# Patient Record
Sex: Female | Born: 1995 | Race: White | Hispanic: No | Marital: Single | Smoking: Never smoker
Health system: Southern US, Community
[De-identification: ages and names within clinical notes are randomized; demographics above are authoritative.]

## PROBLEM LIST (undated history)

## (undated) DIAGNOSIS — J189 Pneumonia, unspecified organism: Secondary | ICD-10-CM

## (undated) DIAGNOSIS — Z8619 Personal history of other infectious and parasitic diseases: Secondary | ICD-10-CM

## (undated) HISTORY — DX: Personal history of other infectious and parasitic diseases: Z86.19

## (undated) HISTORY — DX: Pneumonia, unspecified organism: J18.9

## (undated) HISTORY — PX: TONSILLECTOMY: SUR1361

---
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
## 2013-03-02 IMAGING — CT CT ABD-PELV W/ CM
2 of 4 series · 16 of 46 positions shown, 18 images · IV contrast (APPLIED)
Comparison: None.

CLINICAL DATA: Abdomen pain, vomiting since [REDACTED]

CT ABDOMEN AND PELVIS WITH CONTRAST
TECHNIQUE: Multidetector CT imaging of the abdomen and pelvis was
performed following the standard protocol during bolus
administration of intravenous contrast.
Contrast: 100mL OMNIPAQUE IOHEXOL 300 MG/ML  SOLN

[Series 2: abd/pelvis 5.0 b31f · axial · 0.66mm/px · z∈[-656,-276]mm · 13 of 84 slices shown, 15 images]
[im 4/84  soft-tissue]
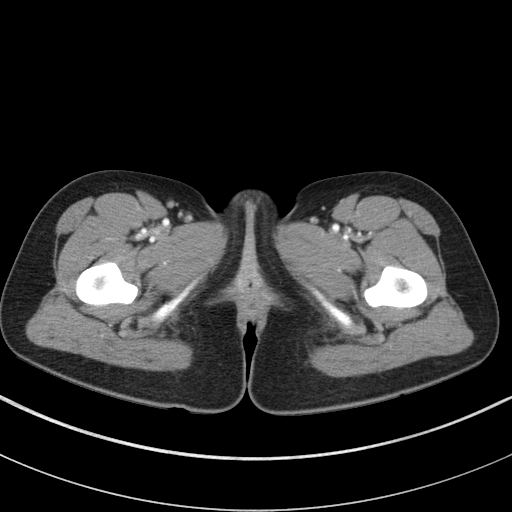
[im 4/84  bone]
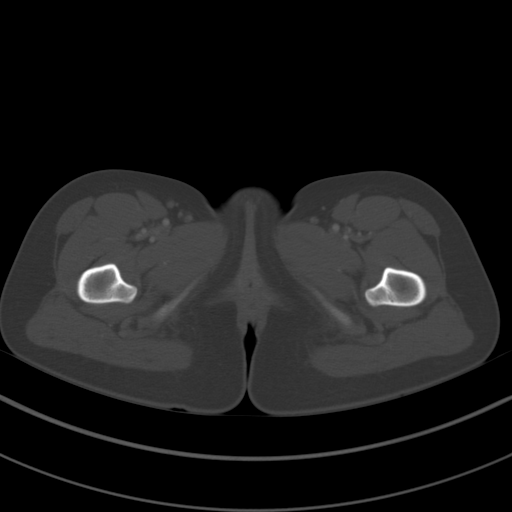
[im 10/84  soft-tissue]
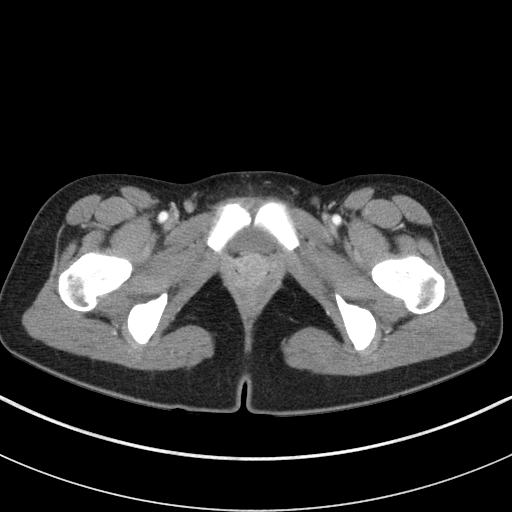
[im 17/84  soft-tissue]
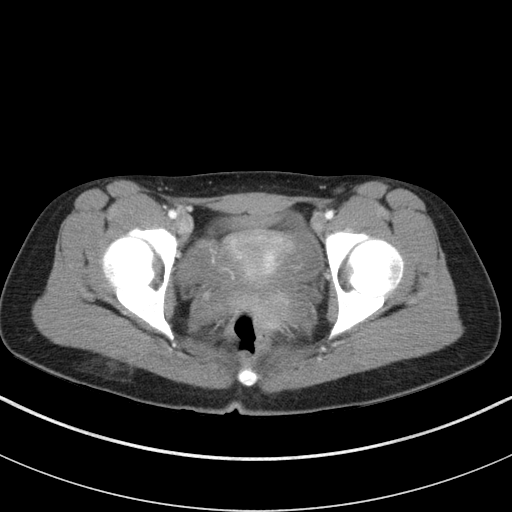
[im 24/84  soft-tissue]
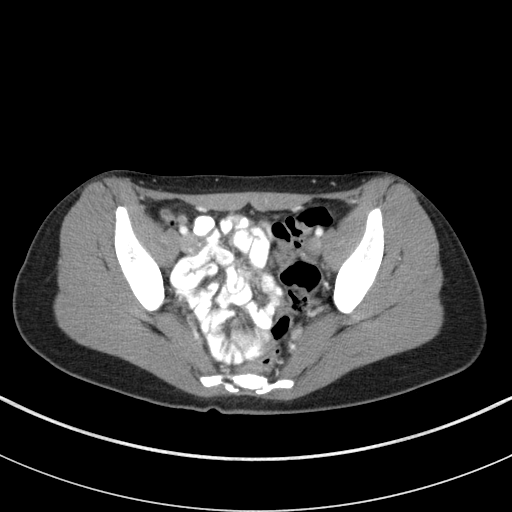
[im 30/84  soft-tissue]
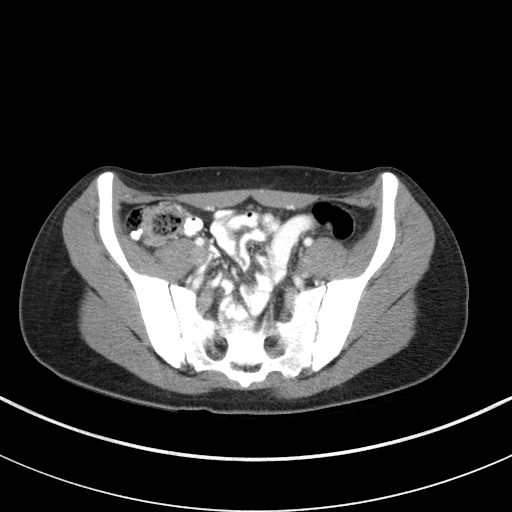
[im 37/84  soft-tissue]
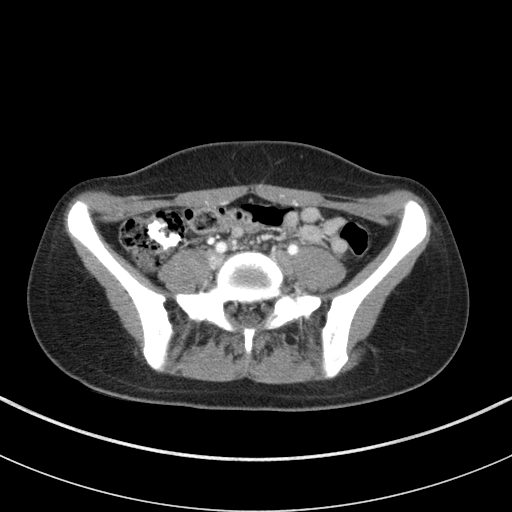
[im 44/84  soft-tissue]
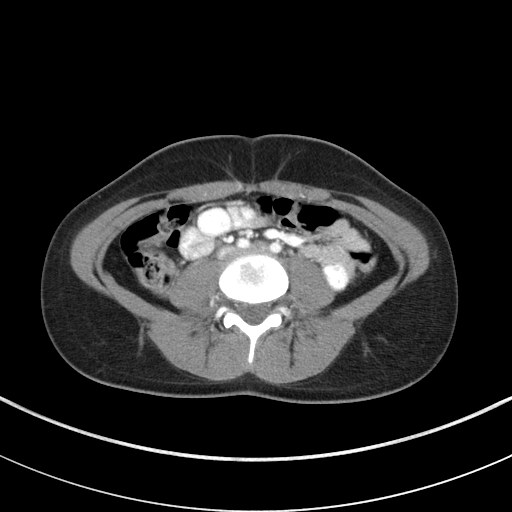
[im 47/84  soft-tissue]
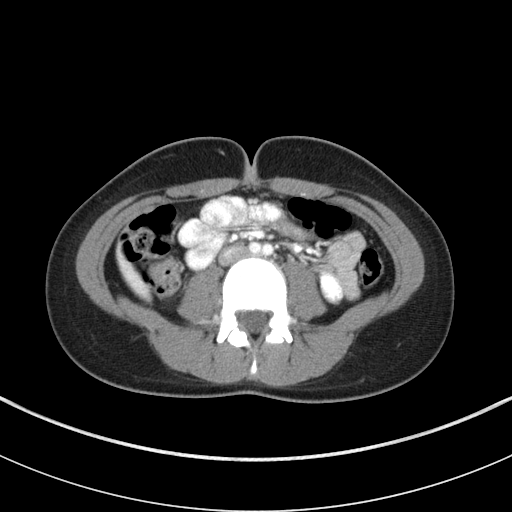
[im 54/84  soft-tissue]
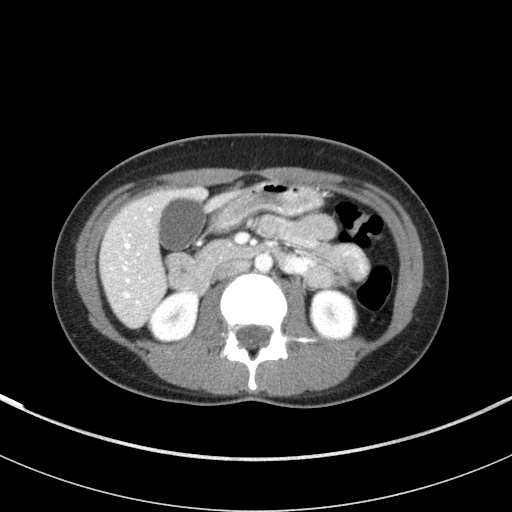
[im 54/84  bone]
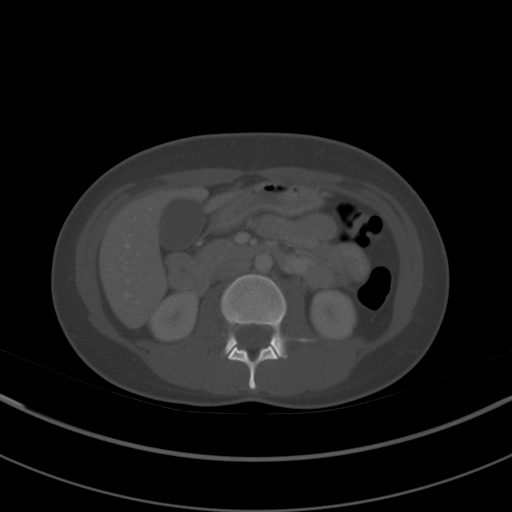
[im 60/84  soft-tissue]
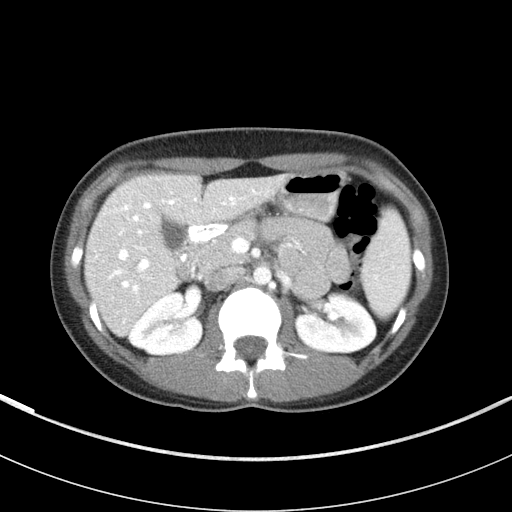
[im 67/84  soft-tissue]
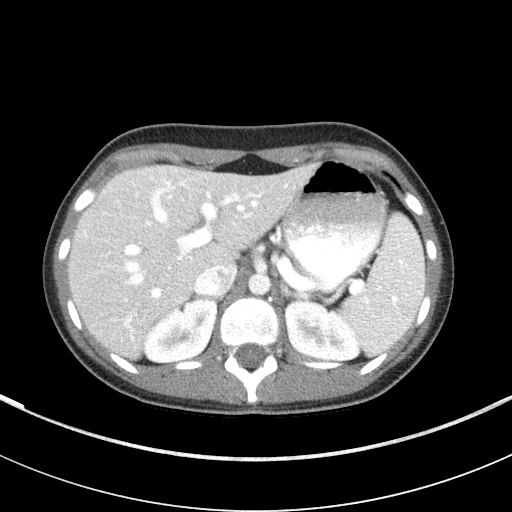
[im 74/84  soft-tissue]
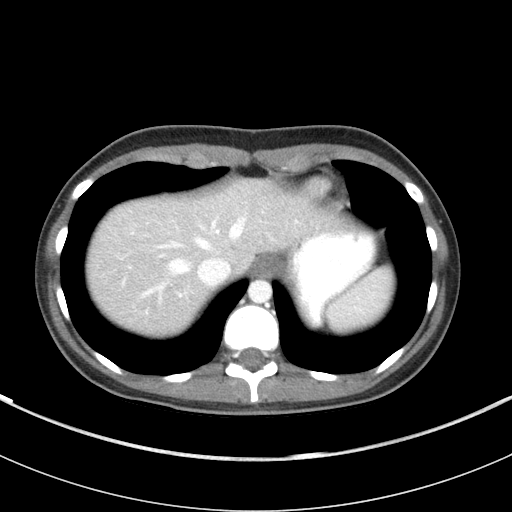
[im 80/84  soft-tissue]
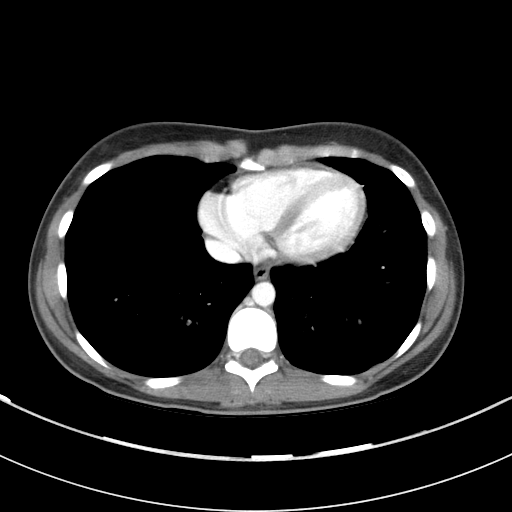

[Series 5: abd/pelvis 3.0 coronal · coronal · 0.69mm/px · 3 of 64 slices shown]
[im 22/64  soft-tissue]
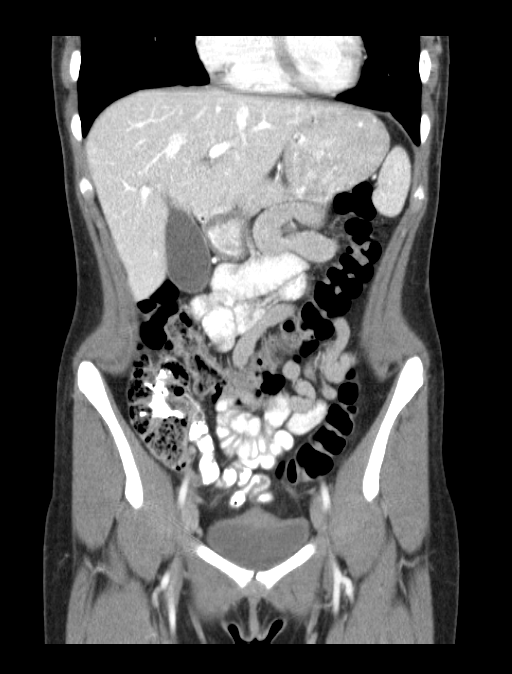
[im 29/64  soft-tissue]
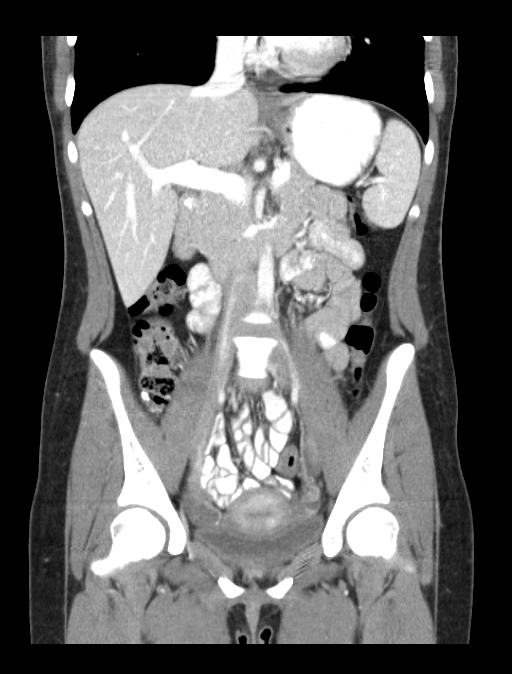
[im 36/64  soft-tissue]
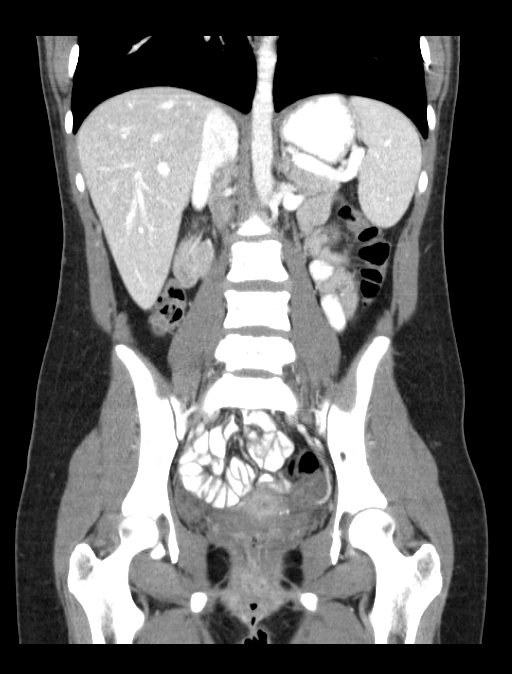

[16 of 46 positions shown; findings below may reference images not displayed]

FINDINGS: The lung bases are clear.  The liver enhances with no
focal abnormality and no ductal dilatation is seen.  No calcified
gallstones are seen within the slightly distended gallbladder.  The
pancreas is normal in size and the pancreatic duct is not dilated.
The adrenal glands and spleen are unremarkable.  The stomach is not
well distended but no abnormality is seen.  The kidneys enhance
with no calculus or mass and no hydronephrosis is seen.  The
abdominal aorta is normal in caliber.  No adenopathy is seen.

The urinary bladder is not well distended but no abnormality is
seen.  The uterus is anteverted.  No adnexal lesion is seen.  There
do appear to be small ovarian follicles present bilaterally.  No
significant free fluid is seen.  The appendix is visualized low in
the right pelvis anteriorly and does contain some fluid.  However
it only measures 6 mm in diameter and no other evidence of
inflammation is seen.  Clinical follow-up is recommended.  The
terminal ileum is unremarkable.  No bony abnormality is seen.
IMPRESSION: 1.  No significant abnormality is noted.  The appendix is fluid-
filled low in the right anterior pelvis, but no inflammatory
changes are evident by CT.  Recommend clinical follow-up.
2.  Bilateral ovarian follicles.  No free fluid.

## 2013-05-15 ENCOUNTER — Encounter: Payer: Self-pay | Admitting: Family Medicine

## 2013-05-15 ENCOUNTER — Ambulatory Visit (INDEPENDENT_AMBULATORY_CARE_PROVIDER_SITE_OTHER): Payer: 59 | Admitting: Family Medicine

## 2013-05-15 VITALS — BP 125/79 | HR 78 | Wt 105.0 lb

## 2013-05-15 DIAGNOSIS — B029 Zoster without complications: Secondary | ICD-10-CM

## 2013-05-15 MED ORDER — VALACYCLOVIR HCL 1 G PO TABS: 1000.0000 mg | ORAL_TABLET | Freq: Three times a day (TID) | ORAL | Status: DC

## 2013-05-15 NOTE — Progress Notes (Signed)
CC: Rebecca Moore is a 18 y.o. female is here for shingles?   Subjective: HPI:   Patient complains of a rash that is localized beneath the left eyebrow has been present for the past 24 hours it began Sunday morning has not been getting better or worse since onset. It is mildly painful to the touch described as a burning and stinging sensation but no discomfort when not manipulating the wound.  She thinks that the outbreak was preceded by one-2 days of mild numbness at the site of the outbreak but she cannot be sure. No interventions as of yet. She states that she has had similar symptoms 2-3 times prior in her life have improved with acyclovir and has been given promote diagnosis of shingles outbreak. Recently and remotely she denies fevers, chills, psychological stress, physical stress, eye pain, vision loss, pain with blinking, photophobia. She denies rashes elsewhere. States that episodes in the past were always preceded by prolonged sun exposure which occurred late last week.   Review Of Systems Outlined In HPI  Past Medical History  Diagnosis Date  . Pneumonia   . Asthma   . History of chicken pox     Past Surgical History  Procedure Laterality Date  . Tonsillectomy  age 89   Family History  Problem Relation Age of Onset  . Heart disease Maternal Grandmother     MI  . Cancer Maternal Grandmother     breast  . Cancer Maternal Grandfather     throat and lung  . Asthma Mother   . Asthma Sister     History   Social History  . Marital Status: Single    Spouse Name: N/A    Number of Children: N/A  . Years of Education: N/A   Occupational History  . student    Social History Main Topics  . Smoking status: Never Smoker   . Smokeless tobacco: Never Used  . Alcohol Use: No  . Drug Use: No  . Sexual Activity: Yes    Birth Control/ Protection: Condom, Pill   Other Topics Concern  . Not on file   Social History Narrative  . No narrative on file     Objective: BP  125/79  Pulse 78  Wt 105 lb (47.628 kg)  General: Alert and Oriented, No Acute Distress HEENT: Pupils equal, round, reactive to light. Conjunctivae clear.  External ears unremarkable, canals clear with intact TMs with appropriate landmarks.  Middle ear appears open without effusion. Pink inferior turbinates.  Moist mucous membranes, pharynx without inflammation nor lesions.  Neck supple without palpable lymphadenopathy nor abnormal masses. Extremities: No peripheral edema.  Strong peripheral pulses.  Mental Status: No depression, anxiety, nor agitation. Skin: Warm and dry.  Just beneath the left lateral eyebrow there is 0.5 cm x 1 cm cluster of clear vesicles with a mild base of erythema which is tender to the touch.  Assessment & Plan: Rebecca Moore was seen today for shingles?.  Diagnoses and associated orders for this visit:  Shingles - valACYclovir (VALTREX) 1000 MG tablet; Take 1 tablet (1,000 mg total) by mouth 3 (three) times daily.    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 Fortunately treatment remains the same therefore start Valtrex for the next 7 days.Signs and symptoms requring emergent/urgent reevaluation were discussed with the patient.  Return if symptoms worsen or fail to improve.

## 2013-06-09 ENCOUNTER — Ambulatory Visit (INDEPENDENT_AMBULATORY_CARE_PROVIDER_SITE_OTHER): Payer: 59 | Admitting: Sports Medicine

## 2013-06-09 ENCOUNTER — Encounter: Payer: Self-pay | Admitting: Sports Medicine

## 2013-06-09 VITALS — BP 93/64 | HR 69 | Ht 64.0 in | Wt 104.0 lb

## 2013-06-09 DIAGNOSIS — K580 Irritable bowel syndrome with diarrhea: Secondary | ICD-10-CM

## 2013-06-09 DIAGNOSIS — K589 Irritable bowel syndrome without diarrhea: Secondary | ICD-10-CM

## 2013-06-09 NOTE — Assessment & Plan Note (Signed)
Occurs predominately about a week before her cycles. She is really not using the Levsin. I have recommended she use a fiber supplement, likely Metamucil several times a day. Return to see me on an as needed basis.

## 2013-06-09 NOTE — Patient Instructions (Signed)
Irritable Bowel Syndrome  Irritable Bowel Syndrome (IBS) is caused by a disturbance of normal bowel function. Other terms used are spastic colon, mucous colitis, and irritable colon. It does not require surgery, nor does it lead to cancer. There is no cure for IBS. But with proper diet, stress reduction, and medication, you will find that your problems (symptoms) will gradually disappear or improve. IBS is a common digestive disorder. It usually appears in late adolescence or early adulthood. Women develop it twice as often as men.  CAUSES   After food has been digested and absorbed in the small intestine, waste material is moved into the colon (large intestine). In the colon, water and salts are absorbed from the undigested products coming from the small intestine. The remaining residue, or fecal material, is held for elimination. Under normal circumstances, gentle, rhythmic contractions on the bowel walls push the fecal material along the colon towards the rectum. In IBS, however, these contractions are irregular and poorly coordinated. The fecal material is either retained too long, resulting in constipation, or expelled too soon, producing diarrhea.  SYMPTOMS   The most common symptom of IBS is pain. It is typically in the lower left side of the belly (abdomen). But it may occur anywhere in the abdomen. It can be felt as heartburn, backache, or even as a dull pain in the arms or shoulders. The pain comes from excessive bowel-muscle spasms and from the buildup of gas and fecal material in the colon. This pain:  · Can range from sharp belly (abdominal) cramps to a dull, continuous ache.  · Usually worsens soon after eating.  · Is typically relieved by having a bowel movement or passing gas.  Abdominal pain is usually accompanied by constipation. But it may also produce diarrhea. The diarrhea typically occurs right after a meal or upon arising in the morning. The stools are typically soft and watery. They are often  flecked with secretions (mucus).  Other symptoms of IBS include:  · Bloating.  · Loss of appetite.  · Heartburn.  · Feeling sick to your stomach (nausea).  · Belching  · Vomiting  · Gas.  IBS may also cause a number of symptoms that are unrelated to the digestive system:  · Fatigue.  · Headaches.  · Anxiety  · Shortness of breath  · Difficulty in concentrating.  · Dizziness.  These symptoms tend to come and go.  DIAGNOSIS   The symptoms of IBS closely mimic the symptoms of other, more serious digestive disorders. So your caregiver may wish to perform a variety of additional tests to exclude these disorders. He/she wants to be certain of learning what is wrong (diagnosis). The nature and purpose of each test will be explained to you.  TREATMENT  A number of medications are available to help correct bowel function and/or relieve bowel spasms and abdominal pain. Among the drugs available are:  · Mild, non-irritating laxatives for severe constipation and to help restore normal bowel habits.  · Specific anti-diarrheal medications to treat severe or prolonged diarrhea.  · Anti-spasmodic agents to relieve intestinal cramps.  · Your caregiver may also decide to treat you with a mild tranquilizer or sedative during unusually stressful periods in your life.  The important thing to remember is that if any drug is prescribed for you, make sure that you take it exactly as directed. Make sure that your caregiver knows how well it worked for you.  HOME CARE INSTRUCTIONS   · Avoid foods that   are high in fat or oils. Some examples are:heavy cream, butter, frankfurters, sausage, and other fatty meats.  · Avoid foods that have a laxative effect, such as fruit, fruit juice, and dairy products.  · Cut out carbonated drinks, chewing gum, and "gassy" foods, such as beans and cabbage. This may help relieve bloating and belching.  · Bran taken with plenty of liquids may help relieve constipation.  · Keep track of what foods seem to trigger  your symptoms.  · Avoid emotionally charged situations or circumstances that produce anxiety.  · Start or continue exercising.  · Get plenty of rest and sleep.  MAKE SURE YOU:   · Understand these instructions.  · Will watch your condition.  · Will get help right away if you are not doing well or get worse.  Document Released: 12/22/2004 Document Revised: 03/16/2011 Document Reviewed: 08/12/2007  ExitCare® Patient Information ©2014 [employer].    Diet and Irritable Bowel Syndrome   No cure has been found for irritable bowel syndrome (IBS). Many options are available to treat the symptoms. Your caregiver will give you the best treatments available for your symptoms. He or she will also encourage you to manage stress and to make changes to your diet. You need to work with your caregiver and Registered Dietician to find the best combination of medicine, diet, counseling, and support to control your symptoms. The following are some diet suggestions.  FOODS THAT MAKE IBS WORSE  · Fatty foods, such as French fries.  · Milk products, such as cheese or ice cream.  · Chocolate.  · Alcohol.  · Caffeine (found in coffee and some sodas).  · Carbonated drinks, such as soda.  If certain foods cause symptoms, you should eat less of them or stop eating them.  FOOD JOURNAL   · Keep a journal of the foods that seem to cause distress. Write down:  · What you are eating during the day and when.  · What problems you are having after eating.  · When the symptoms occur in relation to your meals.  · What foods always make you feel badly.  · Take your notes with you to your caregiver to see if you should stop eating certain foods.  FOODS THAT MAKE IBS BETTER  Fiber reduces IBS symptoms, especially constipation, because it makes stools soft, bulky, and easier to pass. Fiber is found in bran, bread, cereal, beans, fruit, and vegetables. Examples of foods with fiber include:  · Apples.  · Peaches.  · Pears.  · Berries.  · Figs.  · Broccoli,  raw.  · Cabbage.  · Carrots.  · Raw peas.  · Kidney beans.  · Lima beans.  · Whole-grain bread.  · Whole-grain cereal.  Add foods with fiber to your diet a little at a time. This will let your body get used to them. Too much fiber at once might cause gas and swelling of your abdomen. This can trigger symptoms in a person with IBS. Caregivers usually recommend a diet with enough fiber to produce soft, painless bowel movements. High fiber diets may cause gas and bloating. However, these symptoms often go away within a few weeks, as your body adjusts.  In many cases, dietary fiber may lessen IBS symptoms, particularly constipation. However, it may not help pain or diarrhea. High fiber diets keep the colon mildly enlarged (distended) with the added fiber. This may help prevent spasms in the colon. Some forms of fiber also keep water in the   stool, thereby preventing hard stools that are difficult to pass.   Besides telling you to eat more foods with fiber, your caregiver may also tell you to get more fiber by taking a fiber pill or drinking water mixed with a special high fiber powder. An example of this is a natural fiber laxative containing psyllium seed.   TIPS  · Large meals can cause cramping and diarrhea in people with IBS. If this happens to you, try eating 4 or 5 small meals a day, or try eating less at each of your usual 3 meals. It may also help if your meals are low in fat and high in carbohydrates. Examples of carbohydrates are pasta, rice, whole-grain breads and cereals, fruits, and vegetables.  · If dairy products cause your symptoms to flare up, you can try eating less of those foods. You might be able to handle yogurt better than other dairy products, because it contains bacteria that helps with digestion. Dairy products are an important source of calcium and other nutrients. If you need to avoid dairy products, be sure to talk with a Registered Dietitian about getting these nutrients through other food  sources.  · Drink enough water and fluids to keep your urine clear or pale yellow. This is important, especially if you have diarrhea.  FOR MORE INFORMATION   International Foundation for Functional Gastrointestinal Disorders: www.iffgd.org   National Digestive Diseases Information Clearinghouse: digestive.niddk.nih.gov  Document Released: 03/14/2003 Document Revised: 03/16/2011 Document Reviewed: 11/29/2006  ExitCare® Patient Information ©2014 [employer].

## 2013-06-09 NOTE — Progress Notes (Signed)
  Subjective:    CC: Followup  HPI: This is a pleasant 18 year old female, she has irritable bowel syndrome, diarrhea predominant. She gets occasional tenesmus with desire to stool, and stooling resolves her pain. These are now coming predominately before her cycles. We treated her initially with Levsin, this was tremendously effective but now she just does not take it that often. She is wondering what else can be done and tells me that she doesn't want to take pills. Symptoms are mild, improving.  Past medical history, Surgical history, Family history not pertinant except as noted below, Social history, Allergies, and medications have been entered into the medical record, reviewed, and no changes needed.   Review of Systems: No fevers, chills, night sweats, weight loss, chest pain, or shortness of breath.   Objective:    General: Well Developed, well nourished, and in no acute distress.  Neuro: Alert and oriented x3, extra-ocular muscles intact, sensation grossly intact.  HEENT: Normocephalic, atraumatic, pupils equal round reactive to light, neck supple, no masses, no lymphadenopathy, thyroid nonpalpable.  Skin: Warm and dry, no rashes. Cardiac: Regular rate and rhythm, no murmurs rubs or gallops, no lower extremity edema.  Respiratory: Clear to auscultation bilaterally. Not using accessory muscles, speaking in full sentences. Abdomen: Soft, nontender, nondistended, normal gallstones, no palpable masses.  Impression and Recommendations:

## 2014-07-18 ENCOUNTER — Encounter: Payer: Self-pay | Admitting: Sports Medicine

## 2014-10-25 ENCOUNTER — Ambulatory Visit (INDEPENDENT_AMBULATORY_CARE_PROVIDER_SITE_OTHER): Payer: 59

## 2014-10-25 ENCOUNTER — Ambulatory Visit (INDEPENDENT_AMBULATORY_CARE_PROVIDER_SITE_OTHER): Payer: 59 | Admitting: Osteopathic Medicine

## 2014-10-25 ENCOUNTER — Encounter: Payer: Self-pay | Admitting: Osteopathic Medicine

## 2014-10-25 VITALS — BP 112/78 | HR 82 | Temp 98.2°F | Wt 115.0 lb

## 2014-10-25 DIAGNOSIS — J209 Acute bronchitis, unspecified: Secondary | ICD-10-CM | POA: Diagnosis not present

## 2014-10-25 DIAGNOSIS — R05 Cough: Secondary | ICD-10-CM

## 2014-10-25 DIAGNOSIS — R059 Cough, unspecified: Secondary | ICD-10-CM

## 2014-10-25 DIAGNOSIS — R062 Wheezing: Secondary | ICD-10-CM | POA: Diagnosis not present

## 2014-10-25 MED ORDER — AZITHROMYCIN 250 MG PO TABS: ORAL_TABLET | ORAL | Status: DC

## 2014-10-25 MED ORDER — ALBUTEROL SULFATE 108 (90 BASE) MCG/ACT IN AEPB: 1.0000 | INHALATION_SPRAY | RESPIRATORY_TRACT | Status: AC | PRN

## 2014-10-25 MED ORDER — BENZONATATE 200 MG PO CAPS: 200.0000 mg | ORAL_CAPSULE | Freq: Three times a day (TID) | ORAL | Status: DC | PRN

## 2014-10-25 NOTE — Patient Instructions (Signed)

## 2014-10-25 NOTE — Progress Notes (Signed)
HPI: Rebecca Moore is a 19 y.o. female who [facility]  [place] for chief complaint of:  Chief Complaint  Patient presents with  . Acute Visit     x 4 days cough cold congestion    . Location: chest . Quality: cough/congestion . Severity: mild to moderate . Duration: 4 days . Context: (+) sick contact  . Modifying factors: no OTC treatment . Assoc signs/symptoms: sweaty, no sinus pressure pressure, coughed up mucus a few times but mostly dry, no fever/chills   Past medical, social and family history reviewed: Past Medical History  Diagnosis Date  . Pneumonia   . Asthma   . History of chicken pox    Past Surgical History  Procedure Laterality Date  . Tonsillectomy  age 89   Social History  Substance Use Topics  . Smoking status: Never Smoker   . Smokeless tobacco: Never Used  . Alcohol Use: No   Family History  Problem Relation Age of Onset  . Heart disease Maternal Grandmother     MI  . Cancer Maternal Grandmother     breast  . Cancer Maternal Grandfather     throat and lung  . Asthma Mother   . Asthma Sister     No current outpatient prescriptions on file.   No current facility-administered medications for this visit.   Allergies  Allergen Reactions  . Flagyl [Metronidazole] Nausea And Vomiting      Review of Systems: CONSTITUTIONAL: Neg fever/chills, no unintentional weight changes HEAD/EYES/EARS/NOSE/THROAT: No headache/vision change or hearing change, no sore throat CARDIAC: No chest pain/pressure/palpitations, no orthopnea RESPIRATORY: (+) cough as per HPI, no shortness of breath/wheeze GASTROINTESTINAL: No nausea/vomiting  Exam:  BP 112/78 mmHg  Pulse 82  Wt 115 lb (52.164 kg)  SpO2 97% Constitutional: VSS, see above. General Appearance: alert, well-developed, well-nourished, NAD Eyes: Normal lids and conjunctive, non-icteric sclera, PERRLA Ears, Nose, Mouth, Throat: Normal external inspection  ears/nares/mouth/lips/gums, Normal TM bilaterally, MMM, posterior pharynx without erythema/exudate Neck: No masses, trachea midline. No thyroid enlargement/tenderness/mass appreciated Respiratory: Normal respiratory effort. Mild wheeze worse on L apex but is diffuse bilateral, no rhonchi/rales Cardiovascular: S1/S2 normal, no murmur/rub/gallop auscultated. RRR.   No results found for this or any previous visit (from the past 72 hour(s)).  CXR personally reviewed - lungs appear enlarged for age but clear, possible increased bronchial markings indicating bronchitis/inflammation, no mass/pneumonia visible, heart size normal, await radiology overread  ASSESSMENT/PLAN:  Cough - Plan: DG Chest 2 View, benzonatate (TESSALON) 200 MG capsule  Acute bronchitis, unspecified organism - Plan: azithromycin (ZITHROMAX) 250 MG tablet, Albuterol Sulfate (PROAIR RESPICLICK) 108 (90 BASE) MCG/ACT AEPB   No hx asthma or other lung pathology, no previous inhaler use. Pt complains only of cough, no SOB, await radiology over-read. If no improvement would certainly consider repeat CXR and PFT. Will call pt if radiology overread changes management.

## 2014-10-29 ENCOUNTER — Encounter: Payer: Self-pay | Admitting: Physician Assistant

## 2014-10-29 ENCOUNTER — Ambulatory Visit (INDEPENDENT_AMBULATORY_CARE_PROVIDER_SITE_OTHER): Payer: 59 | Admitting: Physician Assistant

## 2014-10-29 VITALS — BP 98/71 | HR 95 | Temp 98.4°F | Ht 64.0 in | Wt 114.0 lb

## 2014-10-29 DIAGNOSIS — J45901 Unspecified asthma with (acute) exacerbation: Secondary | ICD-10-CM | POA: Diagnosis not present

## 2014-10-29 MED ORDER — PREDNISONE 20 MG PO TABS: ORAL_TABLET | ORAL | Status: DC

## 2014-10-29 MED ORDER — MONTELUKAST SODIUM 10 MG PO TABS: 10.0000 mg | ORAL_TABLET | Freq: Every day | ORAL | Status: DC

## 2014-10-29 NOTE — Progress Notes (Signed)
   Subjective:    Patient ID: Rebecca Moore, female    DOB: [birth year], 19 y.o.   MRN: [id number]  HPI Patient is a 19 year old female who presents to the clinic to follow-up after 02/24/14 diagnosis of acute bronchitis by Dr. Sunnie NielsenNatalie Alexander. She was given Z-Pak, albuterol inhaler and Tessalon Perles. She does feel some better but her lungs continued to feel tight. She's having a lot of wheezing. Her cough is still somewhat productive. She denies any fever or chills. She denies any ear pain or sinus pressure. She does have a childhood history of asthma but no recent exacerbations. She has previously been on Singulair in the past as well as other antihistamines but not currently on anything. Tessalon Perles to help significantly with cough.   Review of Systems    see history of present illness. Objective:   Physical Exam  Constitutional: She is oriented to person, place, and time. She appears well-developed and well-nourished.  HENT:  Head: Normocephalic and atraumatic.  Cardiovascular: Normal rate, regular rhythm and normal heart sounds.   Pulmonary/Chest:  Bilateral wheezing of lower lobes.   Neurological: She is alert and oriented to person, place, and time.  Skin: Skin is dry.  Psychiatric: She has a normal mood and affect. Her behavior is normal.          Assessment & Plan:  Asthmatic bronchitis with acute exacerbation- patient was given a prednisone taper today. Singulair was prescribed to start taking at bedtime. Could use in combination with zyrtec as well. Start singulair first.  Continue using rescue inhaler every 2-4 hours for the next couple days. If no improvement in the next 48 hours please give her office call. We will repeat chest x-ray and consider pulmonary function testing for ICS inhaler.

## 2014-10-29 NOTE — Patient Instructions (Addendum)
singulair at bedtime. Can use in combination with zyrtec.

## 2014-12-06 ENCOUNTER — Ambulatory Visit (INDEPENDENT_AMBULATORY_CARE_PROVIDER_SITE_OTHER): Payer: 59 | Admitting: Sports Medicine

## 2014-12-06 ENCOUNTER — Encounter: Payer: Self-pay | Admitting: Sports Medicine

## 2014-12-06 VITALS — BP 112/65 | HR 88 | Temp 97.5°F | Wt 115.0 lb

## 2014-12-06 DIAGNOSIS — B029 Zoster without complications: Secondary | ICD-10-CM

## 2014-12-06 MED ORDER — VALACYCLOVIR HCL 1 G PO TABS: 1000.0000 mg | ORAL_TABLET | Freq: Two times a day (BID) | ORAL | Status: DC

## 2014-12-06 NOTE — Assessment & Plan Note (Signed)
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 There does not appear to be any involvement of the eye or cornea. Valtrex twice a day for 7 days, return if no better.

## 2014-12-06 NOTE — Progress Notes (Signed)
  Subjective:    CC: "I think I have shingles again"  HPI: Patient presents with a rash that appeared over her left eye that began on Monday. She says that she felt some pain in the area on Sunday and noticed the area began to appear red by Monday. By Tuesday the area had little bumps that were crusting. She says that she has had singles "around 10 times" before and that it is always in this same area and it always looks and feels just like this. She denies any headache, visual changes, visual pain, numbness, tingling, or weakness. She usually has taken acyclovir or "something that has a similar ending" before and this has always worked well.  No other complaints today and she feels well otherwise.  Past medical history, Surgical history, Family history not pertinant except as noted below, Social history, Allergies, and medications have been entered into the medical record, reviewed, and no changes needed.   Review of Systems: No fevers, chills, night sweats, weight loss, chest pain, or shortness of breath.   Objective:    General: Well Developed, well nourished, and in no acute distress.  Neuro: Alert and oriented x3, extra-ocular muscles intact, sensation grossly intact.  HEENT: Normocephalic, atraumatic, pupils equal round reactive to light, no corneal abnormalities noted, neck supple, no masses, no lymphadenopathy, thyroid nonpalpable.  Skin: Warm and dry, small crusted vesiculopapular rash over the left eyebrow and not extending into the orbit. This rash is mildly tender to touch. Cardiac: Regular rate and rhythm, no murmurs rubs or gallops, no lower extremity edema.  Respiratory: Clear to auscultation bilaterally. Not using accessory muscles, speaking in full sentences.   Impression and Recommendations:    Patient likely has another flare of shingles. No concern for corneal involvement given location, lack of eye pain, and lack of physical findings. Will treat with valacyclovir. Patient  to return if symptoms do not resolve w/in 10 days.

## 2014-12-06 NOTE — Patient Instructions (Signed)

## 2014-12-20 ENCOUNTER — Ambulatory Visit: Payer: 59 | Admitting: Sports Medicine

## 2014-12-21 ENCOUNTER — Encounter: Payer: Self-pay | Admitting: Sports Medicine

## 2014-12-21 ENCOUNTER — Ambulatory Visit (INDEPENDENT_AMBULATORY_CARE_PROVIDER_SITE_OTHER): Payer: 59 | Admitting: Sports Medicine

## 2014-12-21 VITALS — BP 108/60 | HR 61 | Wt 117.0 lb

## 2014-12-21 DIAGNOSIS — B029 Zoster without complications: Secondary | ICD-10-CM

## 2014-12-21 NOTE — Assessment & Plan Note (Signed)
Resolved with Valtrex. 

## 2014-12-21 NOTE — Progress Notes (Signed)
  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  Past medical history, Surgical history, Family history not pertinant except as noted below, Social history, Allergies, and medications have been entered into the medical record, reviewed, and no changes needed.   Review of Systems: No fevers, chills, night sweats, weight loss, chest pain, or shortness of breath.   Objective:    General: Well Developed, well nourished, and in no acute distress.  Neuro: Alert and oriented x3, extra-ocular muscles intact, sensation grossly intact.  HEENT: Normocephalic, atraumatic, pupils equal round reactive to light, neck supple, no masses, no lymphadenopathy, thyroid nonpalpable. No lesions visible where they were previously located. Skin: Warm and dry, no rashes. Cardiac: Regular rate and rhythm, no murmurs rubs or gallops, no lower extremity edema.  Respiratory: Clear to auscultation bilaterally. Not using accessory muscles, speaking in full sentences.  Impression and Recommendations:

## 2015-05-06 MED FILL — AMOXICILLIN 500 MG CAPSULE: 500 | 10 days supply | Qty: 30 | Fill #0

## 2015-05-06 MED FILL — HYDROCODON-APAP 7.5-325: 7.5-325 | 2 days supply | Qty: 12 | Fill #0

## 2015-05-06 MED FILL — MAGIC MOUTHWASH BOP FORM: 10 days supply | Qty: 150 | Fill #0

## 2015-05-10 MED FILL — HYDROCODON-APAP 7.5-325: 7.5-325 | 4 days supply | Qty: 20 | Fill #0

## 2017-11-08 DIAGNOSIS — J209 Acute bronchitis, unspecified: Secondary | ICD-10-CM | POA: Diagnosis not present

## 2017-11-08 DIAGNOSIS — J45909 Unspecified asthma, uncomplicated: Secondary | ICD-10-CM | POA: Diagnosis not present

## 2018-12-07 DIAGNOSIS — Z20828 Contact with and (suspected) exposure to other viral communicable diseases: Secondary | ICD-10-CM | POA: Diagnosis not present

## 2018-12-07 DIAGNOSIS — Z1159 Encounter for screening for other viral diseases: Secondary | ICD-10-CM | POA: Diagnosis not present
